# Patient Record
Sex: Male | Born: 1950 | Race: White | Hispanic: No | Marital: Married | Smoking: Never smoker
Health system: Southern US, Community
[De-identification: ages and names within clinical notes are randomized; demographics above are authoritative.]

## PROBLEM LIST (undated history)

## (undated) DIAGNOSIS — K219 Gastro-esophageal reflux disease without esophagitis: Secondary | ICD-10-CM

## (undated) DIAGNOSIS — F419 Anxiety disorder, unspecified: Secondary | ICD-10-CM

---

## 2013-03-22 ENCOUNTER — Emergency Department (HOSPITAL_BASED_OUTPATIENT_CLINIC_OR_DEPARTMENT_OTHER)
Admission: EM | Admit: 2013-03-22 | Discharge: 2013-03-22 | Disposition: A | Discharge status: Home / Self Care | Payer: PRIVATE HEALTH INSURANCE | Attending: Emergency Medicine | Admitting: Emergency Medicine

## 2013-03-22 ENCOUNTER — Encounter (HOSPITAL_BASED_OUTPATIENT_CLINIC_OR_DEPARTMENT_OTHER): Payer: Self-pay | Admitting: *Deleted

## 2013-03-22 DIAGNOSIS — L0291 Cutaneous abscess, unspecified: Secondary | ICD-10-CM | POA: Insufficient documentation

## 2013-03-22 DIAGNOSIS — M25569 Pain in unspecified knee: Secondary | ICD-10-CM | POA: Insufficient documentation

## 2013-03-22 DIAGNOSIS — L259 Unspecified contact dermatitis, unspecified cause: Secondary | ICD-10-CM | POA: Insufficient documentation

## 2013-03-22 DIAGNOSIS — K219 Gastro-esophageal reflux disease without esophagitis: Secondary | ICD-10-CM | POA: Insufficient documentation

## 2013-03-22 DIAGNOSIS — L0390 Cellulitis, unspecified: Secondary | ICD-10-CM

## 2013-03-22 DIAGNOSIS — M79609 Pain in unspecified limb: Secondary | ICD-10-CM | POA: Insufficient documentation

## 2013-03-22 DIAGNOSIS — Z79899 Other long term (current) drug therapy: Secondary | ICD-10-CM | POA: Insufficient documentation

## 2013-03-22 DIAGNOSIS — L299 Pruritus, unspecified: Secondary | ICD-10-CM | POA: Insufficient documentation

## 2013-03-22 DIAGNOSIS — F411 Generalized anxiety disorder: Secondary | ICD-10-CM | POA: Insufficient documentation

## 2013-03-22 HISTORY — DX: Anxiety disorder, unspecified: F41.9

## 2013-03-22 HISTORY — DX: Gastro-esophageal reflux disease without esophagitis: K21.9

## 2013-03-22 MED ORDER — CEPHALEXIN 500 MG PO CAPS: 500.0000 mg | ORAL_CAPSULE | Freq: Four times a day (QID) | ORAL | Status: DC

## 2013-03-22 NOTE — ED Provider Notes (Signed)
History  This chart was scribed for Aiyanah Kalama B. Bernette Mayers, MD by Manuela Schwartz, ED scribe. This patient was seen in room MH07/MH07 and the patient's care was started at 1606.   CSN: [id number]  Arrival date & time 03/22/13  1606   First MD Initiated Contact with Patient 03/22/13 1614      Chief Complaint  Patient presents with  . Rash   Patient is a 62 y.o. male presenting with rash. The history is provided by the patient. No language interpreter was used.  Rash Pain location: left forearm and left knee. Pain radiates to:  Does not radiate Pain severity:  No pain Duration:  1 day Timing:  Constant Progression:  Unchanged Chronicity:  New Context comment:  Pulling weeds in backyard 2 days ago Relieved by:  Nothing Worsened by:  Nothing tried Ineffective treatments: benadryl. Associated symptoms: no chills, no fever, no nausea, no shortness of breath and no vomiting   Associated symptoms comment:  Itching   HPI Comments: [name] Mcintyre is a 62 y.o. male who presents to the Emergency Department complaining of red itchy rash over left forearm and left knee which he noticed yesterday after pulling weeds in his backyard two days ago. He states tried benadryl without relief from symptoms. He denies any other symptoms. He denies any difficulty breathing or passing out. He states has not had reactions to poison ivy before and denies similar symptoms previously.   Past Medical History  Diagnosis Date  . Anxiety   . Acid reflux     History reviewed. No pertinent past surgical history.  History reviewed. No pertinent family history.  History  Substance Use Topics  . Smoking status: Never Smoker   . Smokeless tobacco: Not on file  . Alcohol Use: Yes      Review of Systems  Constitutional: Negative for fever and chills.  Respiratory: Negative for shortness of breath.   Gastrointestinal: Negative for nausea and vomiting.  Skin: Positive for rash (over left forearm and left  knee).  Neurological: Negative for weakness.  All other systems reviewed and are negative.   A complete 10 system review of systems was obtained and all systems are negative except as noted in the HPI and PMH.   Allergies  Review of patient's allergies indicates no known allergies.  Home Medications   Current Outpatient Rx  Name  Route  Sig  Dispense  Refill  . Dexlansoprazole 30 MG capsule   Oral   Take 30 mg by mouth daily.         Marland Kitchen escitalopram (LEXAPRO) 10 MG tablet   Oral   Take 10 mg by mouth daily.           Triage Vitals: BP 127/87  Pulse 94  Temp(Src) 98.1 F (36.7 C) (Oral)  Resp 20  Ht 6' (1.829 m)  Wt 205 lb (92.987 kg)  BMI 27.8 kg/m2  SpO2 95%  Physical Exam  Nursing note and vitals reviewed. Constitutional: He is oriented to person, place, and time. He appears well-developed and well-nourished.  HENT:  Head: Normocephalic and atraumatic.  Eyes: EOM are normal. Pupils are equal, round, and reactive to light.  Neck: Normal range of motion. Neck supple.  Cardiovascular: Normal rate, normal heart sounds and intact distal pulses.   Pulmonary/Chest: Effort normal and breath sounds normal.  Abdominal: Bowel sounds are normal. He exhibits no distension. There is no tenderness.  Musculoskeletal: Normal range of motion. He exhibits no edema and no tenderness.  Neurological:  He is alert and oriented to person, place, and time. He has normal strength. No cranial nerve deficit or sensory deficit.  Skin: Skin is warm and dry. Rash noted.  Urticarial rash to left forearm and upper arm as well as left knee. There is some streaking on the left arm concerning for secondary cellulitis  Psychiatric: He has a normal mood and affect.    ED Course  Procedures (including critical care time) DIAGNOSTIC STUDIES: Oxygen Saturation is 95% on room air, adequate by my interpretation.    COORDINATION OF CARE: At 430 PM Discussed treatment plan with patient which includes  keflex. Patient agrees.   Labs Reviewed - No data to display No results found.   1. Contact dermatitis   2. Cellulitis       MDM  I personally performed the services described in this documentation, which was scribed in my presence. The recorded information has been reviewed and is accurate.  Probably a contact dermatitis with early secondary cellulitis. He is leaving the country tomorrow, so will begin Keflex. Topical steroids/oral benadryl as well.          Aaralynn Shepheard B. Bernette Mayers, MD 03/22/13 1901

## 2013-03-22 NOTE — ED Notes (Signed)
Pt states he was pulling weeds on Sat and today noticed red bumps on left forearm. Some have red streaks extending from them. +itching. Using Benadryl without relief.

## 2015-07-18 ENCOUNTER — Other Ambulatory Visit (HOSPITAL_BASED_OUTPATIENT_CLINIC_OR_DEPARTMENT_OTHER): Payer: Self-pay | Admitting: Physical Medicine and Rehabilitation

## 2015-07-18 DIAGNOSIS — M25511 Pain in right shoulder: Secondary | ICD-10-CM

## 2015-07-23 ENCOUNTER — Ambulatory Visit (HOSPITAL_BASED_OUTPATIENT_CLINIC_OR_DEPARTMENT_OTHER): Payer: PRIVATE HEALTH INSURANCE

## 2015-07-23 ENCOUNTER — Ambulatory Visit (HOSPITAL_BASED_OUTPATIENT_CLINIC_OR_DEPARTMENT_OTHER)
Admission: RE | Admit: 2015-07-23 | Discharge: 2015-07-23 | Disposition: A | Discharge status: Home / Self Care | Payer: PRIVATE HEALTH INSURANCE | Source: Ambulatory Visit | Attending: Physical Medicine and Rehabilitation | Admitting: Physical Medicine and Rehabilitation

## 2015-07-23 DIAGNOSIS — R937 Abnormal findings on diagnostic imaging of other parts of musculoskeletal system: Secondary | ICD-10-CM | POA: Diagnosis not present

## 2015-07-23 DIAGNOSIS — M7581 Other shoulder lesions, right shoulder: Secondary | ICD-10-CM | POA: Diagnosis not present

## 2015-07-23 DIAGNOSIS — M19011 Primary osteoarthritis, right shoulder: Secondary | ICD-10-CM | POA: Diagnosis not present

## 2015-07-23 DIAGNOSIS — M25511 Pain in right shoulder: Secondary | ICD-10-CM

## 2015-07-30 ENCOUNTER — Other Ambulatory Visit (HOSPITAL_BASED_OUTPATIENT_CLINIC_OR_DEPARTMENT_OTHER): Payer: PRIVATE HEALTH INSURANCE

## 2016-05-31 ENCOUNTER — Emergency Department (HOSPITAL_BASED_OUTPATIENT_CLINIC_OR_DEPARTMENT_OTHER): Payer: 59

## 2016-05-31 ENCOUNTER — Emergency Department (HOSPITAL_BASED_OUTPATIENT_CLINIC_OR_DEPARTMENT_OTHER)
Admission: EM | Admit: 2016-05-31 | Discharge: 2016-05-31 | Disposition: A | Discharge status: Home / Self Care | Payer: 59 | Attending: Emergency Medicine | Admitting: Emergency Medicine

## 2016-05-31 ENCOUNTER — Encounter (HOSPITAL_BASED_OUTPATIENT_CLINIC_OR_DEPARTMENT_OTHER): Payer: Self-pay | Admitting: *Deleted

## 2016-05-31 DIAGNOSIS — Z7982 Long term (current) use of aspirin: Secondary | ICD-10-CM | POA: Diagnosis not present

## 2016-05-31 DIAGNOSIS — M660 Rupture of popliteal cyst: Secondary | ICD-10-CM

## 2016-05-31 DIAGNOSIS — M79605 Pain in left leg: Secondary | ICD-10-CM | POA: Diagnosis present

## 2016-05-31 DIAGNOSIS — M7122 Synovial cyst of popliteal space [Baker], left knee: Secondary | ICD-10-CM | POA: Insufficient documentation

## 2016-05-31 NOTE — ED Provider Notes (Signed)
[facility] Provider Note   CSN: [id number] Arrival date & time: 05/31/16  1420     History   Chief Complaint Chief Complaint  Patient presents with  . Leg Swelling    HPI [name] Mcintyre is a 65 y.o. male.  [place] flew to [place] 1 week ago.  After the flight he noticed some pain in his left leg.  He said that it felt like a cramp.   He noticed some swelling to his left leg today.  He denies any CP or SOB.      Past Medical History:  Diagnosis Date  . Acid reflux   . Anxiety     There are no active problems to display for this patient.   History reviewed. No pertinent surgical history.     Home Medications    Prior to Admission medications   Medication Sig Start Date End Date Taking? Authorizing Provider  aspirin EC 81 MG tablet Take 81 mg by mouth daily.   Yes Historical Provider, MD  escitalopram (LEXAPRO) 10 MG tablet Take 10 mg by mouth daily.    Historical Provider, MD    Family History No family history on file.  Social History Social History  Substance Use Topics  . Smoking status: Never Smoker  . Smokeless tobacco: Never Used  . Alcohol use Yes     Allergies   Review of patient's allergies indicates no known allergies.   Review of Systems Review of Systems  Musculoskeletal:       Left leg swelling  All other systems reviewed and are negative.    Physical Exam Updated Vital Signs BP 121/87   Pulse 78   Temp 98.5 F (36.9 C) (Oral)   Resp 18   Ht 6' (1.829 m)   Wt 210 lb (95.3 kg)   SpO2 96%   BMI 28.48 kg/m   Physical Exam  Constitutional: He is oriented to person, place, and time. He appears well-developed and well-nourished.  HENT:  Head: Normocephalic and atraumatic.  Right Ear: External ear normal.  Left Ear: External ear normal.  Nose: Nose normal.  Mouth/Throat: Oropharynx is clear and moist.  Eyes: Conjunctivae and EOM are normal. Pupils are equal, round, and reactive to light.  Neck: Normal  range of motion. Neck supple.  Cardiovascular: Normal rate, regular rhythm, normal heart sounds and intact distal pulses.   Pulmonary/Chest: Effort normal and breath sounds normal.  Abdominal: Soft. Bowel sounds are normal.  Musculoskeletal:  Left leg swelling > right  Neurological: He is alert and oriented to person, place, and time.  Skin: Skin is warm and dry.  Psychiatric: He has a normal mood and affect. His behavior is normal. Judgment and thought content normal.  Nursing note and vitals reviewed.    ED Treatments / Results  Labs (all labs ordered are listed, but only abnormal results are displayed) Labs Reviewed - No data to display  EKG  EKG Interpretation None       Radiology [place] Venous Img Lower Unilateral Left  Result Date: 05/31/2016 CLINICAL DATA:  65 year old male with a history of pain and swelling EXAM: LEFT LOWER EXTREMITY VENOUS DOPPLER ULTRASOUND TECHNIQUE: Gray-scale sonography with graded compression, as well as color Doppler and duplex ultrasound were performed to evaluate the lower extremity deep venous systems from the level of the common femoral vein and including the common femoral, femoral, profunda femoral, popliteal and calf veins including the posterior tibial, peroneal and gastrocnemius veins when visible. The superficial great saphenous  vein was also interrogated. Spectral Doppler was utilized to evaluate flow at rest and with distal augmentation maneuvers in the common femoral, femoral and popliteal veins. COMPARISON:  None. FINDINGS: Contralateral Common Femoral Vein: Respiratory phasicity is normal and symmetric with the symptomatic side. No evidence of thrombus. Normal compressibility. Common Femoral Vein: No evidence of thrombus. Normal compressibility, respiratory phasicity and response to augmentation. Saphenofemoral Junction: No evidence of thrombus. Normal compressibility and flow on color Doppler imaging. Profunda Femoral Vein: No evidence of  thrombus. Normal compressibility and flow on color Doppler imaging. Femoral Vein: No evidence of thrombus. Normal compressibility, respiratory phasicity and response to augmentation. Popliteal Vein: No evidence of thrombus. Normal compressibility, respiratory phasicity and response to augmentation. Calf Veins: No evidence of thrombus. Normal compressibility and flow on color Doppler imaging. Superficial Great Saphenous Vein: No evidence of thrombus. Normal compressibility and flow on color Doppler imaging. Other Findings: Lentiform anechoic collection superficial to the gastrocnemius within the posterior proximal left calf measuring 14 cm x 4.4 cm x 1.4 cm. No internal flow. IMPRESSION: Sonographic survey negative for DVT. Superficial fluid collection in the proximal posterior calf of the left lower leg. This may represent ruptured Baker's cyst, seroma, resolving hematoma, or less likely infection. Signed, Yvone NeuJaime S. Loreta AveWagner, DO [facility] Electronically Signed   By: Gilmer MorJaime  Wagner D.O.   On: 05/31/2016 16:01    Procedures Procedures (including critical care time)  Medications Ordered in ED Medications - No data to display   Initial Impression / Assessment and Plan / ED Course  I have reviewed the triage vital signs and the nursing notes.  Pertinent labs & imaging results that were available during my care of the patient were reviewed by me and considered in my medical decision making (see chart for details).  Clinical Course    Thankfully, pt does not have a DVT.  He is given the number to ortho to f/u.  He knows to return if worse.  Final Clinical Impressions(s) / ED Diagnoses   Final diagnoses:  Baker's cyst, ruptured    New Prescriptions New Prescriptions   No medications on file     Jacalyn LefevreJulie Ahmeer Tuman, MD 05/31/16 [phone]

## 2016-05-31 NOTE — ED Triage Notes (Signed)
States [place] flew to [place] On Monday a week ago and now has swelling in left leg and tender in calf area. No injury.

## 2016-06-05 ENCOUNTER — Encounter: Payer: Self-pay | Admitting: Family Medicine

## 2016-06-05 ENCOUNTER — Ambulatory Visit (HOSPITAL_BASED_OUTPATIENT_CLINIC_OR_DEPARTMENT_OTHER)
Admission: RE | Admit: 2016-06-05 | Discharge: 2016-06-05 | Disposition: A | Discharge status: Home / Self Care | Payer: 59 | Source: Ambulatory Visit | Attending: Family Medicine | Admitting: Family Medicine

## 2016-06-05 ENCOUNTER — Ambulatory Visit (INDEPENDENT_AMBULATORY_CARE_PROVIDER_SITE_OTHER): Payer: 59 | Admitting: Family Medicine

## 2016-06-05 VITALS — BP 113/79 | HR 90 | Temp 98.4°F | Ht 72.0 in | Wt 205.0 lb

## 2016-06-05 DIAGNOSIS — M7989 Other specified soft tissue disorders: Secondary | ICD-10-CM | POA: Diagnosis not present

## 2016-06-05 DIAGNOSIS — M79605 Pain in left leg: Secondary | ICD-10-CM

## 2016-06-05 NOTE — Patient Instructions (Signed)
This is consistent with either a hemorrhagic ruptured bakers cyst or a large hematoma dissecting the calf muscle which is very painful. We will repeat your doppler ultrasound to be on the safe side. Compression stockings as often as possible. Heat 15 minutes at a time 3-4 times a day. Tylenol 500mg  1-2 tabs three times a day (maximum 4 times a day). Elevate above your heart as much as possible. Regular calf raises or flexion/extension of your ankles 3 sets of 10 at least twice a day. Follow up will depend on the ultrasound result.

## 2016-06-07 DIAGNOSIS — M79605 Pain in left leg: Secondary | ICD-10-CM | POA: Insufficient documentation

## 2016-06-07 NOTE — Assessment & Plan Note (Signed)
consistent with hemorrhagic bakers cyst or large hematoma dissecting through calf musculature.  They would like to repeat doppler u/s to be on the safe side so we did that today and it was negative for DVT.  Stressed importance of compression stocking (rx given for medical grade 30mm Hg pressure stockings to thigh level), tylenol, elevation.  Use nsaids only as second line medication.  Discussed regular motion of ankle, calf raises as well.  F/u in 2-4 weeks for reevaluation.

## 2016-06-07 NOTE — Progress Notes (Signed)
PCP: Kem BoroughsARNOLD,[name], [name] Mcintyre  Subjective:   HPI: Patient is a 65 y.o. male here for left leg pain.  Patient reports on 8/17 he started to get worsening pain back of left knee going down his left leg. 3 days prior to this he flew to [place]. No acute injury or trauma. Has some associated warmth, redness in calf area. No fevers or chills or sweats. Felt like a cramp initially. Started to improve after visiting ED but pain has worsened again. Tried aleve, ACE wrap. Doppler u/s negative for DVT though showed a fluid collection consistent with ? Bakers cyst. No skin changes, numbness otherwise.  Past Medical History:  Diagnosis Date  . Acid reflux   . Anxiety     Current Outpatient Prescriptions on File Prior to Visit  Medication Sig Dispense Refill  . aspirin EC 81 MG tablet Take 81 mg by mouth daily.     No current facility-administered medications on file prior to visit.     No past surgical history on file.  No Known Allergies  Social History   Social History  . Marital status: Married    Spouse name: N/A  . Number of children: N/A  . Years of education: N/A   Occupational History  . Not on file.   Social History Main Topics  . Smoking status: Never Smoker  . Smokeless tobacco: Never Used  . Alcohol use Yes  . Drug use: No  . Sexual activity: Not on file   Other Topics Concern  . Not on file   Social History Narrative  . No narrative on file    No family history on file.  BP 113/79   Pulse 90   Temp 98.4 F (36.9 C) (Oral)   Ht 6' (1.829 m)   Wt 205 lb (93 kg)   BMI 27.80 kg/m   Review of Systems: See HPI above.    Objective:  Physical Exam:  Gen: NAD, comfortable in exam room  Left lower leg: Obvious swelling of calf compared to right side.  No bruising, erythema.  Mild warmth though equivalent to right leg. TTP throughout medial calf and medial popliteal area.  No tenderness anterior knee including joint lines, post patellar  facets. FROM knee and ankle with mild pain on plantarflexion. Negative valgus, varus stress of knee.  Negative lachmanns, apleys, mcmurrays. NVI distally.  Right lower leg: No deformity, tenderness.  FROM knee and ankle without pain.  MSK u/s left calf:  Large fluid collection from popliteal area to about mid-medial gastroc.  Very little of this is completely anechoic though - mostly hypoechoic.  No neovascularity.  Cannot completely compress this.  Popliteal artery visualized and appears normal much more laterally.  Could not visualize vein very well however.    Assessment & Plan:  1. Left leg pain and swelling - consistent with hemorrhagic bakers cyst or large hematoma dissecting through calf musculature.  They would like to repeat doppler u/s to be on the safe side so we did that today and it was negative for DVT.  Stressed importance of compression stocking (rx given for medical grade 30mm Hg pressure stockings to thigh level), tylenol, elevation.  Use nsaids only as second line medication.  Discussed regular motion of ankle, calf raises as well.  F/u in 2-4 weeks for reevaluation.

## 2019-04-06 ENCOUNTER — Ambulatory Visit
Admission: RE | Admit: 2019-04-06 | Discharge: 2019-04-06 | Disposition: A | Discharge status: Home / Self Care | Payer: Medicare Other | Source: Ambulatory Visit | Attending: Sports Medicine | Admitting: Sports Medicine

## 2019-04-06 ENCOUNTER — Ambulatory Visit: Payer: Self-pay

## 2019-04-06 ENCOUNTER — Other Ambulatory Visit: Payer: Self-pay

## 2019-04-06 ENCOUNTER — Ambulatory Visit (INDEPENDENT_AMBULATORY_CARE_PROVIDER_SITE_OTHER): Payer: Medicare Other | Admitting: Sports Medicine

## 2019-04-06 VITALS — BP 100/68 | Ht 72.0 in | Wt 188.0 lb

## 2019-04-06 DIAGNOSIS — M25561 Pain in right knee: Secondary | ICD-10-CM

## 2019-04-07 ENCOUNTER — Encounter: Payer: Self-pay | Admitting: Sports Medicine

## 2019-04-07 NOTE — Progress Notes (Signed)
   Subjective:    Patient ID: [name] Mcintyre, male    DOB: [birth year], 68 y.o.   MRN: [id number]  HPI chief complaint: Right knee pain  Very pleasant 68 year old male comes in today complaining of 7 weeks of right knee pain.  Pain began acutely after he was installing a kick plate onto a door.  Since that time he has had diffuse pain and swelling throughout the knee.  He has also noticed the knee locking and catching.  His pain is progressively getting worse.  He is now to the point where he is unable to actively extend the knee.  Pain can be quite severe, especially with weightbearing or twisting.  He denies any significant problems with his knees in the past.  No prior knee surgeries.  No pain at the hip.  No numbness or tingling.  No imaging to date.  Past medical history reviewed Medications reviewed Allergies reviewed  Review of Systems    As above Objective:   Physical Exam  Well-developed, well-nourished.  No acute distress.  Awake alert and oriented x3.  Vital signs reviewed  Right knee: Patient has a 15 degree extension lag.  He is able to flex the knee to 110 degrees.  There is a trace to 1+ effusion.  He is exquisitely tender to palpation along medial and lateral joint lines.  Positive McMurray's.  Knee is grossly stable ligamentous exam.  No obvious prepatellar swelling.  No erythema.  Neurovascularly intact distally.  Patient ambulates with a significant limp and inability to extend the knee.  X-rays of the right knee including AP, lateral, and sunrise views are unremarkable.  No fracture.  No obvious loose body.  No significant degenerative changes.     Assessment & Plan:   Right knee pain worrisome for displaced meniscal tear  Patient's inability to extend the knee coupled with his physical exam are concerning for a displaced meniscal tear.  His x-rays are unremarkable.  I would like to expedite an MRI of the right knee and I will call him with those results when  available.  We will delineate further treatment based on those findings.  This note was dictated using Dragon naturally speaking software and may contain errors in syntax, spelling, or content which have not been identified prior to signing this note.

## 2019-04-29 ENCOUNTER — Ambulatory Visit
Admission: RE | Admit: 2019-04-29 | Discharge: 2019-04-29 | Disposition: A | Discharge status: Home / Self Care | Payer: Medicare Other | Source: Ambulatory Visit | Attending: Sports Medicine | Admitting: Sports Medicine

## 2019-04-29 ENCOUNTER — Other Ambulatory Visit: Payer: Self-pay

## 2019-04-29 DIAGNOSIS — M25561 Pain in right knee: Secondary | ICD-10-CM

## 2019-05-05 ENCOUNTER — Other Ambulatory Visit: Payer: Self-pay

## 2019-05-05 ENCOUNTER — Ambulatory Visit (INDEPENDENT_AMBULATORY_CARE_PROVIDER_SITE_OTHER): Payer: Medicare Other | Admitting: Sports Medicine

## 2019-05-05 VITALS — BP 104/70 | Ht 72.0 in | Wt 187.0 lb

## 2019-05-05 DIAGNOSIS — M1711 Unilateral primary osteoarthritis, right knee: Secondary | ICD-10-CM | POA: Diagnosis present

## 2019-05-06 NOTE — Progress Notes (Signed)
   Subjective:    Patient ID: [name] Mcintyre, male    DOB: 27-Sep-1951, 68 y.o.   MRN: [id number]  HPI   Patient presents today to discuss MRI findings of his right knee.  MRI does not show any meniscal pathology but he has a large area of full-thickness cartilage loss involving the majority of the weightbearing medial femoral condyle.  There is also extensive bone marrow edema as well as a small subchondral fracture in this area.  Since his office visit with me, his pain has improved but he continues to experience an inability to completely extend the knee.  He also endorses stiffness when first getting up from a seated position but he states that he is able to walk without much pain.  He has not noticed any swelling.  Other than the inability to extend the knee he has not noticed any other mechanical symptoms.    Review of Systems As above    Objective:   Physical Exam  Well-developed, well-nourished.  No acute distress.  Awake alert and oriented x3.  Vital signs reviewed.  Right knee shows a 20 degrees extension lag.  Flexion to 130 degrees.  No effusion.  He is tender to palpation along the medial joint line with pain but no popping with McMurray's.  Knee is grossly stable ligamentous exam.  Neurovascularly intact distally.  Patient ambulates with a slightly flexed right knee.  MRI as above      Assessment & Plan:   Advanced right knee DJD  I had a long talk with the patient regarding his right knee predicament.  I spent a significant amount of time reviewing his MRI and discussing his degenerative changes with the use of a knee model.  He has advanced arthritis of this knee for which the definitive treatment is a total knee arthroplasty.  He is hesitant about proceeding with surgery at this point in time.   I recommended that we try some limited formal physical therapy.  He will work diligently at home on trying to regain extension in his knee.  I would like to reevaluate him in  about 6 weeks.  If he does not start to notice improvement fairly soon then I would highly recommend consultation with orthopedic surgery (Dr Rhona Raider) to discuss merits of total knee arthroplasty.  Patient understands.  Although I had previously mentioned a cortisone injection, patient has very little in the way of pain so we will hold on that for now.

## 2019-05-15 ENCOUNTER — Ambulatory Visit: Payer: Medicare Other | Admitting: Physical Therapy

## 2019-05-27 NOTE — Patient Instructions (Signed)
Dr Sherron Monday [address] [place] [address] [phone] Wednesday 05/27/19 at 245p

## 2021-05-30 IMAGING — MR MRI OF THE RIGHT KNEE WITHOUT CONTRAST
4 of 6 series · 22 of 40 positions shown · non-contrast
Comparison: X-rays 04/06/2019

CLINICAL DATA: Anterior right knee pain

EXAM:
MRI OF THE RIGHT KNEE WITHOUT CONTRAST
TECHNIQUE: Multiplanar, multisequence MR imaging of the knee was performed. No
intravenous contrast was administered.

[Series 3: T2 fat-sat · axial · 4.0mm · 0.62mm/px · z∈[-62,+68]mm · 5 of 33 slices shown (1 of 2)]
[im 1/33]
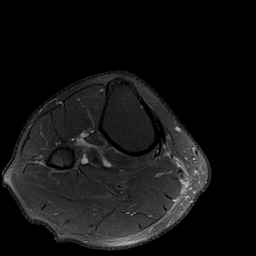
[im 6/33]
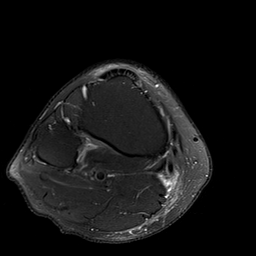
[im 11/33]
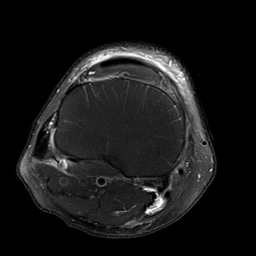
[im 17/33]
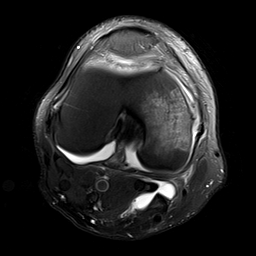
[im 27/33]
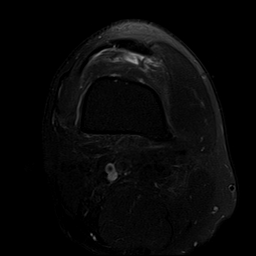

[Series 5: T2 fat-sat · coronal · 4.0mm · 0.31mm/px · 3 of 28 slices shown (2 of 2)]
[im 6/28]
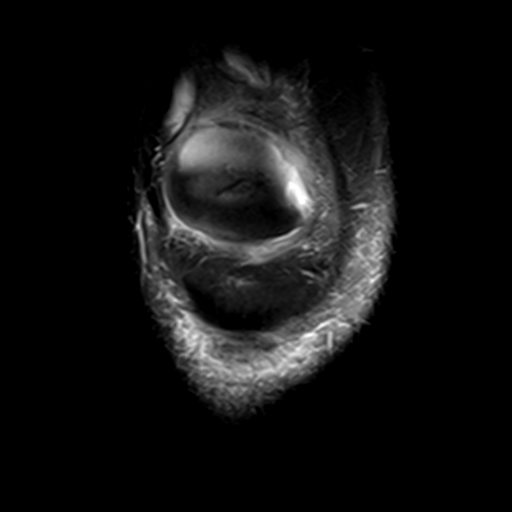
[im 17/28]
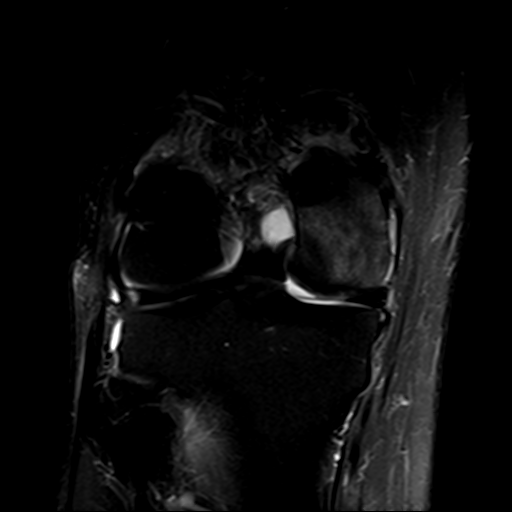
[im 28/28]
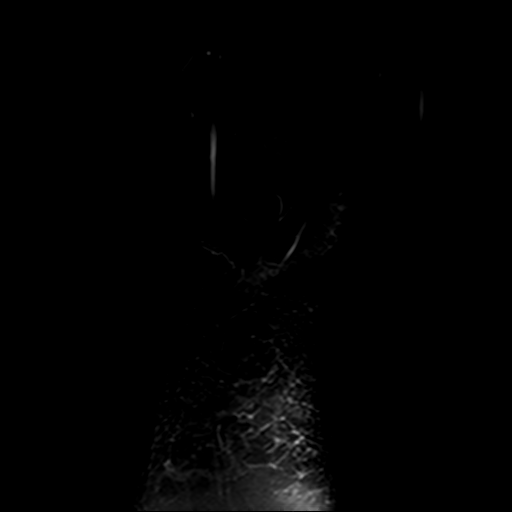

[Series 7: PD fat-sat · sagittal · 3.0mm · 0.31mm/px · 7 of 32 slices shown (1 of 2)]
[im 1/32]
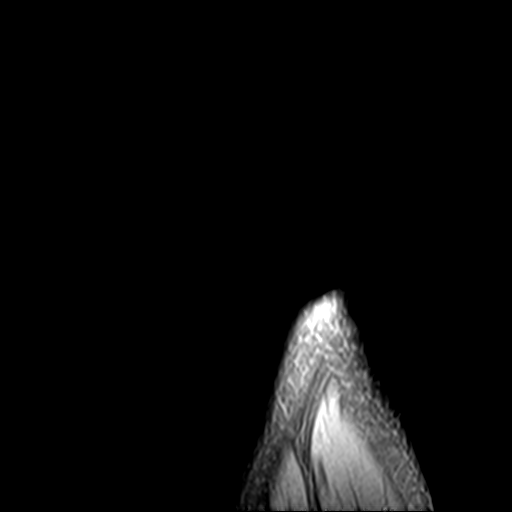
[im 6/32]
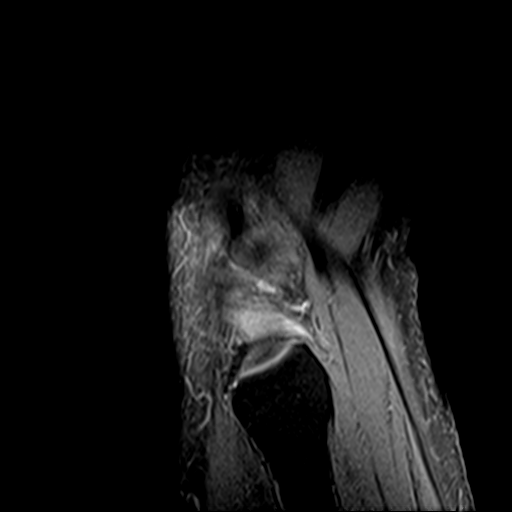
[im 11/32]
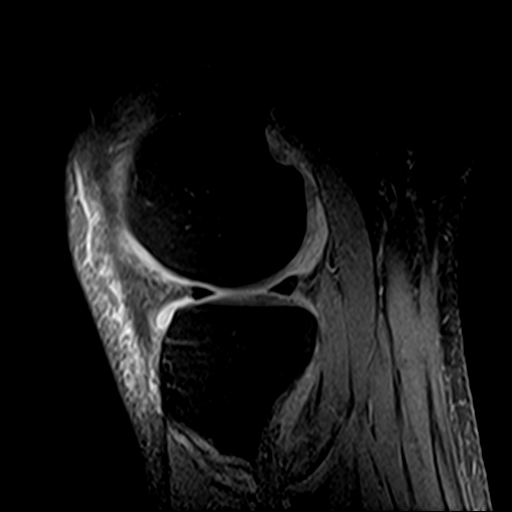
[im 16/32]
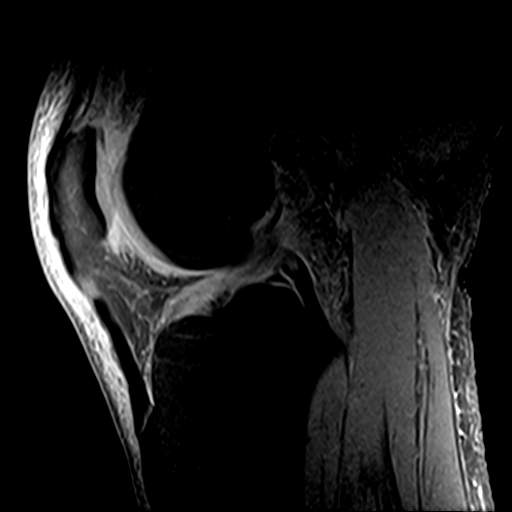
[im 21/32]
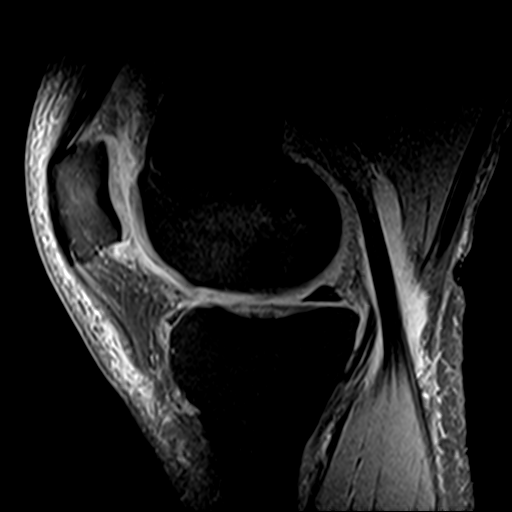
[im 26/32]
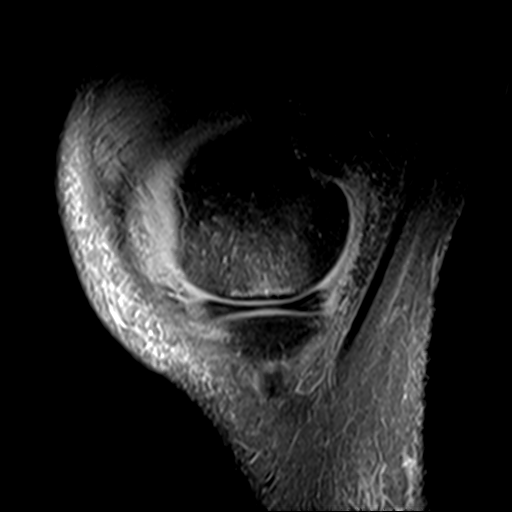
[im 32/32]
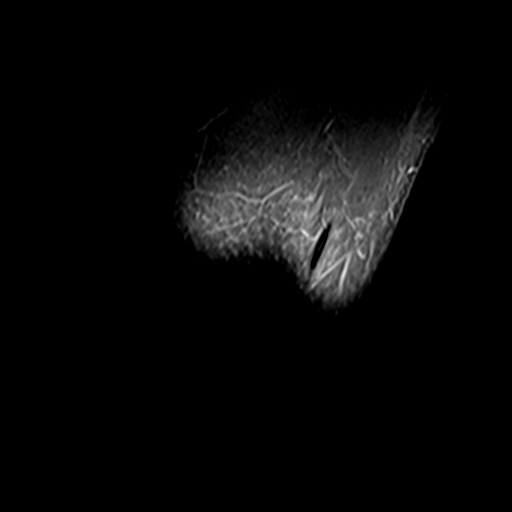

[Series 8: PD fat-sat · coronal · 3.0mm · 0.31mm/px · 7 of 34 slices shown (2 of 2)]
[im 1/34]
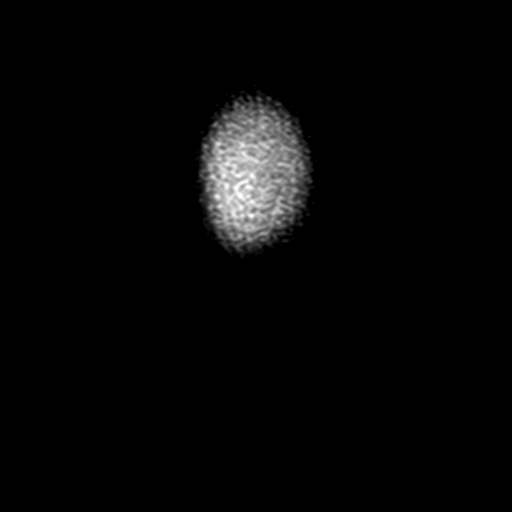
[im 6/34]
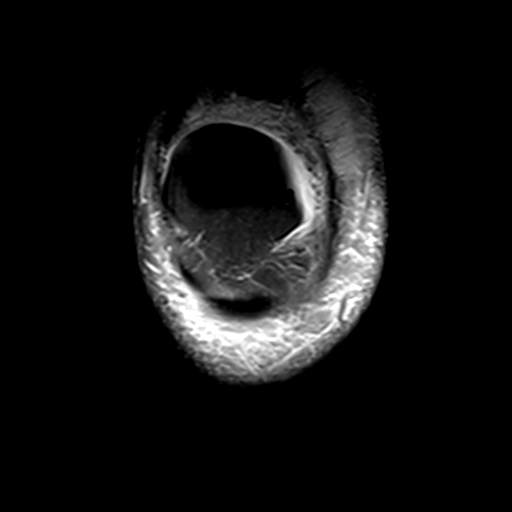
[im 12/34]
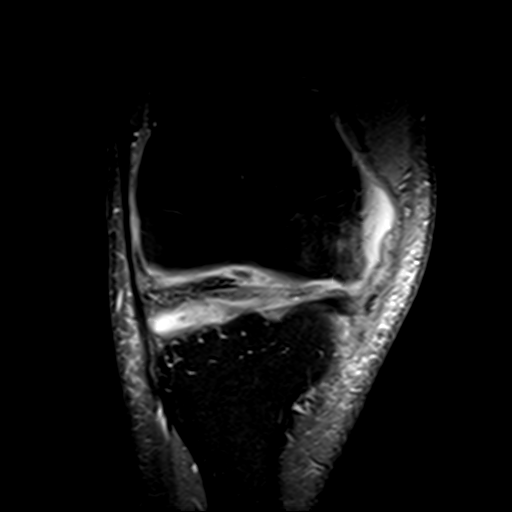
[im 17/34]
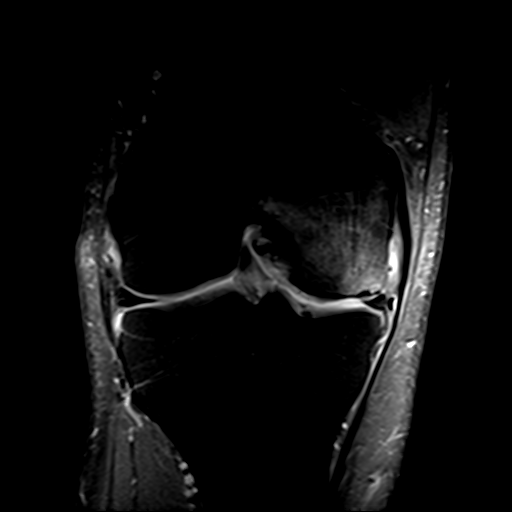
[im 23/34]
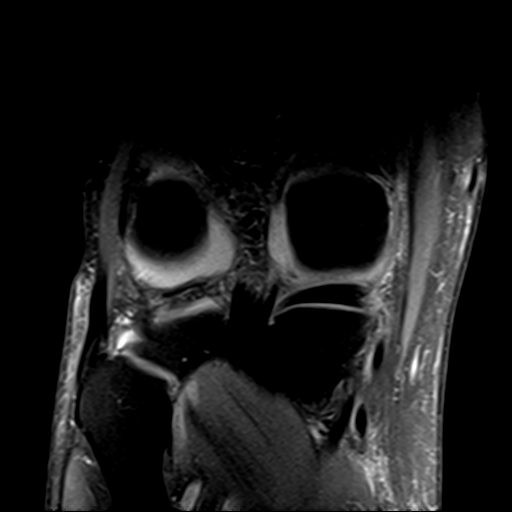
[im 28/34]
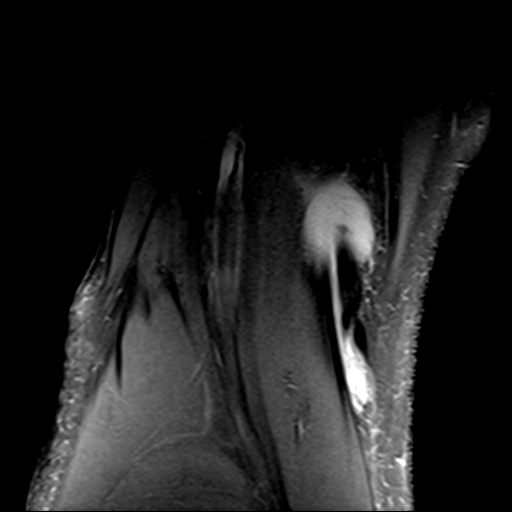
[im 34/34]
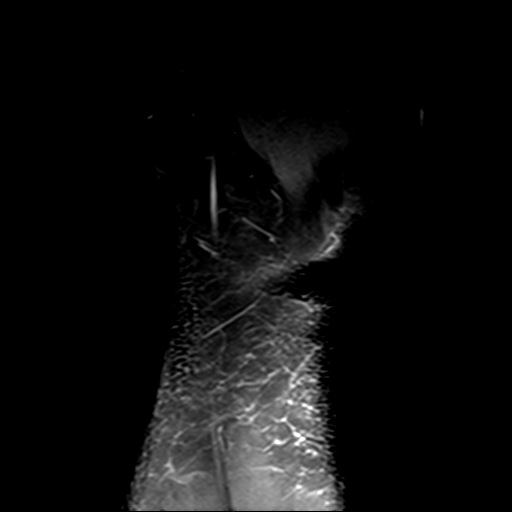

[22 of 40 positions shown; findings below may reference images not displayed]

FINDINGS: MENISCI

Medial meniscus: Intermediate intrasubstance signal within the
meniscus suggesting intrasubstance degeneration without discrete
tear.

Lateral meniscus:  Intact.

LIGAMENTS

Cruciates:  Intact ACL and PCL.

Collaterals: Medial collateral ligament is intact. Lateral
collateral ligament complex is intact.

CARTILAGE

Patellofemoral: Small partial thickness cartilage fissure at the mid
to superior aspect of the lateral patellar facet (series 3, image
13). No discrete trochlear defect.

Medial: Large area of full-thickness cartilage loss involving the
majority of the weight-bearing surface of the medial femoral condyle
measuring approximately 3 cm AP dimension (series 6, image 24) this
by 2 cm transverse dimension (series 5, image 17). There are
multiple cartilage flaps and full-thickness fissures along the
margins of the larger defect (see series 5, image 17 and series 6,
image 23). Near full-thickness fissure along the lateral aspect of
the medial tibial plateau.

Lateral:  Preserved without focal cartilage defect.

Joint:  Trace joint effusion.  Minimal edema within Hoffa's fat

Popliteal Fossa: Small Baker's cyst with evidence of rupture.
Popliteus tendon intact.

Extensor Mechanism:  Intact quadriceps tendon and patellar tendon.

Bones: Extensive bone marrow edema within the medial femoral
condyle. Area of low signal within the medial aspect of the medial
femoral condyle with subtle impaction suggestive of a subchondral
fracture (series 5, image 15).

Other: None.
IMPRESSION: 1. Large area of full-thickness cartilage loss involving the
majority of the weight-bearing medial femoral condyle. There is a
small subchondral fracture and extensive bone marrow edema
throughout the medial femoral condyle.
2. Intact menisci, as clinically questioned.
3. Small Baker's cyst with evidence of rupture.
4. Small partial thickness cartilage fissure of the lateral patellar
facet.

## 2021-10-17 ENCOUNTER — Ambulatory Visit: Payer: Medicare Other | Attending: Internal Medicine

## 2021-10-17 ENCOUNTER — Other Ambulatory Visit (HOSPITAL_BASED_OUTPATIENT_CLINIC_OR_DEPARTMENT_OTHER): Payer: Self-pay

## 2021-10-17 DIAGNOSIS — Z23 Encounter for immunization: Secondary | ICD-10-CM

## 2021-10-17 MED ORDER — PFIZER COVID-19 VAC BIVALENT 30 MCG/0.3ML IM SUSP
INTRAMUSCULAR | 0 refills | Status: AC
  Filled 2021-10-17: qty 0.3, 1d supply, fill #0

## 2021-10-18 NOTE — Progress Notes (Signed)
° °  Covid-19 Vaccination Clinic  Name:  Brahim Dolman    MRN: [id number] DOB: April 12, 1951  10/18/2021  Mr. Noah was observed post Covid-19 immunization for 15 minutes without incident. He was provided with Vaccine Information Sheet and instruction to access the V-Safe system.   Mr. Roop was instructed to call 911 with any severe reactions post vaccine: Difficulty breathing  Swelling of face and throat  A fast heartbeat  A bad rash all over body  Dizziness and weakness   Immunizations Administered     Name Date Dose VIS Date Route   Pfizer Covid-19 Vaccine Bivalent Booster 10/17/2021  4:04 PM 0.3 mL 06/07/2021 Intramuscular   Manufacturer: [employer], [employer]   Lot: [id number]   NDC: [phone]

## 2022-07-13 ENCOUNTER — Other Ambulatory Visit (HOSPITAL_BASED_OUTPATIENT_CLINIC_OR_DEPARTMENT_OTHER): Payer: Self-pay

## 2022-07-13 MED ORDER — FLUAD QUADRIVALENT 0.5 ML IM PRSY
PREFILLED_SYRINGE | INTRAMUSCULAR | 0 refills | Status: AC
  Filled 2022-07-13: qty 0.5, 1d supply, fill #0

## 2022-09-24 ENCOUNTER — Other Ambulatory Visit (HOSPITAL_BASED_OUTPATIENT_CLINIC_OR_DEPARTMENT_OTHER): Payer: Self-pay

## 2022-09-24 MED ORDER — COMIRNATY 30 MCG/0.3ML IM SUSY
PREFILLED_SYRINGE | INTRAMUSCULAR | 0 refills | Status: AC
  Filled 2022-09-24: qty 0.3, 1d supply, fill #0

## 2023-05-31 ENCOUNTER — Other Ambulatory Visit (HOSPITAL_COMMUNITY): Payer: Self-pay

## 2023-07-26 ENCOUNTER — Other Ambulatory Visit (HOSPITAL_BASED_OUTPATIENT_CLINIC_OR_DEPARTMENT_OTHER): Payer: Self-pay

## 2023-07-26 MED ORDER — FLUAD 0.5 ML IM SUSY
0.5000 mL | PREFILLED_SYRINGE | Freq: Once | INTRAMUSCULAR | 0 refills | Status: AC
  Filled 2023-07-26: qty 0.5, 1d supply, fill #0

## 2024-07-08 DEATH — deceased
